# Patient Record
Sex: Male | Born: 1960 | Race: White | Hispanic: No | Marital: Single | Smoking: Former smoker
Health system: Southern US, Community
[De-identification: ages and names within clinical notes are randomized; demographics above are authoritative.]

## PROBLEM LIST (undated history)

## (undated) DIAGNOSIS — F32A Depression, unspecified: Secondary | ICD-10-CM

## (undated) DIAGNOSIS — M797 Fibromyalgia: Secondary | ICD-10-CM

## (undated) DIAGNOSIS — E7800 Pure hypercholesterolemia, unspecified: Secondary | ICD-10-CM

## (undated) DIAGNOSIS — M51369 Other intervertebral disc degeneration, lumbar region without mention of lumbar back pain or lower extremity pain: Secondary | ICD-10-CM

## (undated) DIAGNOSIS — K900 Celiac disease: Secondary | ICD-10-CM

## (undated) DIAGNOSIS — I10 Essential (primary) hypertension: Secondary | ICD-10-CM

## (undated) DIAGNOSIS — M5136 Other intervertebral disc degeneration, lumbar region: Secondary | ICD-10-CM

## (undated) DIAGNOSIS — D649 Anemia, unspecified: Secondary | ICD-10-CM

## (undated) DIAGNOSIS — F329 Major depressive disorder, single episode, unspecified: Secondary | ICD-10-CM

## (undated) HISTORY — PX: APPENDECTOMY: SHX54

## (undated) HISTORY — PX: CHOLECYSTECTOMY: SHX55

---

## 2001-11-22 ENCOUNTER — Emergency Department (HOSPITAL_COMMUNITY): Admission: EM | Admit: 2001-11-22 | Discharge: 2001-11-22 | Payer: Self-pay | Admitting: Emergency Medicine

## 2001-11-22 ENCOUNTER — Encounter: Payer: Self-pay | Admitting: Emergency Medicine

## 2004-04-11 ENCOUNTER — Emergency Department (HOSPITAL_COMMUNITY): Admission: EM | Admit: 2004-04-11 | Discharge: 2004-04-11 | Payer: Self-pay | Admitting: Emergency Medicine

## 2006-07-19 ENCOUNTER — Emergency Department (HOSPITAL_COMMUNITY): Admission: EM | Admit: 2006-07-19 | Discharge: 2006-07-19 | Payer: Self-pay | Admitting: Emergency Medicine

## 2006-12-02 ENCOUNTER — Encounter: Payer: Self-pay | Admitting: Pediatrics

## 2006-12-02 ENCOUNTER — Inpatient Hospital Stay (HOSPITAL_COMMUNITY): Admission: AD | Admit: 2006-12-02 | Discharge: 2006-12-03 | Payer: Self-pay | Admitting: Pediatrics

## 2007-12-11 ENCOUNTER — Emergency Department (HOSPITAL_COMMUNITY): Admission: EM | Admit: 2007-12-11 | Discharge: 2007-12-11 | Payer: Self-pay | Admitting: Emergency Medicine

## 2009-09-05 ENCOUNTER — Observation Stay (HOSPITAL_COMMUNITY): Admission: EM | Admit: 2009-09-05 | Discharge: 2009-09-06 | Payer: Self-pay | Admitting: Internal Medicine

## 2009-09-05 ENCOUNTER — Encounter: Payer: Self-pay | Admitting: Emergency Medicine

## 2010-10-28 ENCOUNTER — Encounter: Payer: Self-pay | Admitting: Cardiology

## 2011-01-08 LAB — BASIC METABOLIC PANEL
Chloride: 106 mEq/L (ref 96–112)
Creatinine, Ser: 0.9 mg/dL (ref 0.4–1.5)
GFR calc Af Amer: >60 mL/min (ref >60)
GFR calc non Af Amer: >60 mL/min (ref >60)
Potassium: 4.2 mEq/L (ref 3.5–5.1)

## 2011-01-08 LAB — DIFFERENTIAL
Eosinophils Absolute: 0.2 10*3/uL (ref 0.0–0.7)
Lymphocytes Relative: 36 % (ref 12–46)
Lymphs Abs: 2.3 10*3/uL (ref 0.7–4.0)
Monocytes Relative: 12 % (ref 3–12)
Neutrophils Relative %: 49 % (ref 43–77)

## 2011-01-08 LAB — LIPID PANEL
Cholesterol: 137 mg/dL (ref 0–200)
HDL: 24 mg/dL — ABNORMAL LOW (ref >39)
LDL Cholesterol: 104 mg/dL — ABNORMAL HIGH (ref 0–99)
Total CHOL/HDL Ratio: 5.7 RATIO
Triglycerides: 43 mg/dL (ref <150)

## 2011-01-08 LAB — IRON AND TIBC: Iron: <10 ug/dL — ABNORMAL LOW (ref 42–135)

## 2011-01-08 LAB — CBC
MCV: 75.7 fL — ABNORMAL LOW (ref 78.0–100.0)
RBC: 4 MIL/uL — ABNORMAL LOW (ref 4.22–5.81)
WBC: 6.5 10*3/uL (ref 4.0–10.5)

## 2011-01-08 LAB — VITAMIN B12: Vitamin B-12: 340 pg/mL (ref 211–911)

## 2011-01-09 LAB — CARDIAC PANEL(CRET KIN+CKTOT+MB+TROPI)
CK, MB: 0.8 ng/mL (ref 0.3–4.0)
CK, MB: 0.9 ng/mL (ref 0.3–4.0)
Relative Index: INVALID (ref 0.0–2.5)
Relative Index: INVALID (ref 0.0–2.5)
Total CK: 93 U/L (ref 7–232)
Total CK: 96 U/L (ref 7–232)

## 2011-01-09 LAB — SEDIMENTATION RATE: Sed Rate: 5 mm/hr (ref 0–16)

## 2011-01-09 LAB — BASIC METABOLIC PANEL
CO2: 25 mEq/L (ref 19–32)
Calcium: 8.4 mg/dL (ref 8.4–10.5)
Creatinine, Ser: 0.7 mg/dL (ref 0.4–1.5)
Glucose, Bld: 103 mg/dL — ABNORMAL HIGH (ref 70–99)

## 2011-01-09 LAB — CBC
Hemoglobin: 10.7 g/dL — ABNORMAL LOW (ref 13.0–17.0)
MCHC: 31.7 g/dL (ref 30.0–36.0)
RDW: 20 % — ABNORMAL HIGH (ref 11.5–15.5)

## 2011-01-09 LAB — IRON AND TIBC
Iron: 16 ug/dL — ABNORMAL LOW (ref 42–135)
Iron: 18 ug/dL — ABNORMAL LOW (ref 42–135)
TIBC: 411 ug/dL (ref 215–435)

## 2011-01-09 LAB — DIFFERENTIAL
Basophils Absolute: 0 10*3/uL (ref 0.0–0.1)
Eosinophils Relative: 3 % (ref 0–5)
Lymphocytes Relative: 36 % (ref 12–46)
Monocytes Absolute: 0.9 10*3/uL (ref 0.1–1.0)

## 2011-01-09 LAB — PROTIME-INR
INR: 1.08 (ref 0.00–1.49)
Prothrombin Time: 13.9 seconds (ref 11.6–15.2)

## 2011-01-09 LAB — URINALYSIS, ROUTINE W REFLEX MICROSCOPIC
Bilirubin Urine: NEGATIVE
Glucose, UA: NEGATIVE mg/dL
Hgb urine dipstick: NEGATIVE
Specific Gravity, Urine: 1.006 (ref 1.005–1.030)
Urobilinogen, UA: 0.2 mg/dL (ref 0.0–1.0)

## 2011-01-09 LAB — LIPID PANEL: VLDL: 7 mg/dL (ref 0–40)

## 2011-01-09 LAB — CK TOTAL AND CKMB (NOT AT ARMC)
CK, MB: 1 ng/mL (ref 0.3–4.0)
Total CK: 112 U/L (ref 7–232)

## 2011-01-09 LAB — RETICULOCYTES
RBC.: 4.53 MIL/uL (ref 4.22–5.81)
Retic Ct Pct: 2 % (ref 0.4–3.1)

## 2011-01-09 LAB — APTT: aPTT: 30 seconds (ref 24–37)

## 2011-01-09 LAB — VITAMIN B12
Vitamin B-12: 301 pg/mL (ref 211–911)
Vitamin B-12: 374 pg/mL (ref 211–911)

## 2011-02-22 NOTE — Discharge Summary (Signed)
Logan Booker, Logan Booker NO.:  [id number]   MEDICAL RECORD NO.:  [id number]          PATIENT TYPE:  INP   LOCATION:  3012                         FACILITY:  [facility]   PHYSICIAN:  Deanna Artis. Hickling, M.D.DATE OF BIRTH:  [birth year]   DATE OF ADMISSION:  12/02/2006  DATE OF DISCHARGE:  12/03/2006                               DISCHARGE SUMMARY   FINAL DIAGNOSIS:  Myelopathy, C6 level.  Evaluated for central cord  syndrome.   PROCEDURES:  MRI of the cervical spine.   COMPLICATIONS:  None.   HOSPITAL COURSE:  The patient was admitted with acute weakness in the  morning, unable to move his limbs well.  Dribbled on himself on the way  to the commode.  He had had a one week history of left leg weakness that  he noted after he fell trying to get out of the shower.   The patient had a history of 2-3 weeks of sleeping excessively and a  feeling of being hot.  His past surgical histories had included root  canal extraction, repair of right fractured shoulder, and appendectomy.   His examination showed weakness that was rather symmetric and mild to  moderate in the upper extremities, moderate to severe in the lower  extremities.  He had a C5-6 level sensation, but preserved rectal tone.  His reflexes are diminished in the upper extremities, brisk in the lower  extremities.  He did not have clonus, nor did he have extensor plantar  responses.   The patient had an MRI scan of the cervical spine, but did not show  evidence of a cord lesion.  It showed mild degenerative joint disease  and C5-6 spondylosis.  Laboratory studies, however, showed evidence of a  potassium of 3.3, which rose to 3.9 with hydration.  His albumin was  2.4.  Sedimentation rate 4.  HIV nonreactive.  Angiotensin converting  enzyme normal.  Liver functions normal.  ANA also negative.  He had a  urinalysis to look for proteinuria, which was negative.  He had elevated  glucose randomly that was 170 and  normal renal function.   Over time, the patient improved in his symptoms to the point where he  felt normal.  We believe that he had hypokalemia secondary to  hydrochlorothiazide, hypoalbuminemia for reasons that are unknown,  hypocalcemia related to the hypoalbuminemia.  We checked a PT/PTT for  liver function and a prealbumin, which was normal, and discharged him  home.  He was to follow up with his primary care doctor.   DISCHARGE MEDICINES:  Included:  1. Triamterene 37.5 mg.  2. Hydrochlorothiazide 12.5 mg daily.  3. Potassium chloride 10 mEq three times a day after meals.      Deanna Artis. Sharene Skeans, M.D.  Electronically Signed     [facility]  D:  01/19/2007  T:  01/19/2007  Job:  [id number]

## 2011-02-22 NOTE — H&P (Signed)
NAMEJOURNEY, RATTERMAN NO.:  [id number]   MEDICAL RECORD NO.:  [id number]          PATIENT TYPE:  INP   LOCATION:  3012                         FACILITY:  [facility]   PHYSICIAN:  Deanna Artis. Hickling, M.D.DATE OF BIRTH:  21-Sep-1961   DATE OF ADMISSION:  12/02/2006  DATE OF DISCHARGE:                              HISTORY & PHYSICAL   CHIEF COMPLAINT:  Weak all over.   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  He is unable to  move his limbs well enough to get out of bed and get to the [place].  He  dribbled on himself on the way to the commode.  He had difficulty  urinating.   He has had a 1-week history of left leg weakness that was noted after he  fell getting out of the shower.  He has also had some pain in his left  hip.   REVIEW OF SYSTEMS:  GENERAL:  He has been sleeping excessively for 2 to  3 weeks.  He has normal appetite.  He had an episode tonight as being  hot and red all over, and this improved after a shower.  His sweating  has been normal.  HEAD AND NECK:  No otitis, pharyngitis, or sinusitis.  PULMONARY:  No asthma, bronchitis, pneumonia, or shortness of breath.  CARDIOVASCULAR:  No hypertension, murmur, congenital heart disease, or  chest pain.  GASTROINTESTINAL:  No nausea, vomiting, diarrhea, or  constipation.  GENITOURINARY:  No urinary tract infection, hematuria.  MUSCULOSKELETAL:  No fractures, sprains, or deformities.  No myalgias or  arthralgias.  ENDOCRINE:  The patient has an elevated glucose, does not  have a past history of diabetes.  No thyroid disease.  ALLERGY/IMMUNOLOGY:  No known environmental allergies.  PSYCHIATRIC:  No  depression.  He does have anxiety and occasionally takes Xanax.  NEUROLOGIC:  No diplopia, dysarthria, dysphagia, tinnitus, syncope,  vertigo.  He has weakness and numbness, particularly in the scalp.  There is hypoesthesia below the  both shoulders.  No seizures, no ataxia,  no incontinence of bowel or bladder as noted above.  A 12-system review  otherwise negative.   PAST MEDICAL HISTORY:  Generally healthy.   PAST SURGICAL HISTORY:  1. Root canal extraction yesterday.  2. Repair of the right fractured shoulder years ago.  3. Appendectomy years ago.   CURRENT MEDICATIONS:  1. Ambien.  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  He takes Xanax 0.5 mg 3 times a week after work.  2. Oxycodone 10/325, after his root canal, every 4 to 6 hours as      needed.  3. Clindamycin 150 mg 2 caps 4 times a day, a.c. and q.h.s.  4. Triamterene.  5. Hydrochlorothiazide 37.5/25 in the morning.  6. Methylprednisolone 4 mg Dosepak to regress over 6 days.   DRUG ALLERGIES:  None.   ENVIRONMENTAL ALLERGIES:  Bee sting.   SOCIAL HISTORY:  No tobacco.  He drinks occasional wine.  No use of  drugs.  He has a significant other.  He lives in [place].   EXAMINATION:  VITAL SIGNS:  Temperature 98.3, blood pressure 148/56,  resting pulse 101, respirations 20, pulse oximetry 97%.  HEENT:  Ears, nose, and throat:  No infections.  NECK:  Supple neck.  No tenderness.  LUNGS:  Clear.  HEART:  No murmurs.  Pulse is normal.  ABDOMEN:  Soft, bowel sounds normal.  No hepatosplenomegaly.  EXTREMITIES:  Normal.  His legs are intermittently spastic.  NEUROLOGIC EXAMINATION:  Awake and alert, without aphasia.  Cranial nerves:  Round, reactive pupils.  Visual fields full.  Extraocular movements full.  Symmetric facial strength.  Midline tongue.  Air conduction is greater than bone conduction.  Motor examination:  Neck is 4+, deltoids 3, biceps 4 to 4+, triceps 4-,  hip extensors and flexors 4, grip 4+.  He can barely appose his fingers  with his thumb.  Legs:  Hip flexors 2, psoas 3, knee flexors and  extensors 2.  It is difficult to overcome his spasticity.  Foot:  Dorsiflexors 3, plantar flexors 5.  He has a  C5-6 level.  Rectal tone is  preserved.  Cerebellar:  No tremor on the finger-to-nose.  Gait cannot be tested.  Deep tendon reflexes were diminished in the upper extremities and brisk  in the lower extremities, without clonus.  He had neutral planter  response.   IMPRESSION:  1. Myelopathy, C6 level.  Differential diagnosis is wide and includes      infection, demyelination, neoplasm, arteriovenous malformation,      unlikely trauma.  This may be a central cord syndrome.  2. Hypokalemia.  3. Glucose intolerance.   PLAN:  1. MRI of the C-spine stat.  2. Laboratories we will recheck after replacing potassium chloride 40      mEq with BMET at 1800 hours.  We will check HIV status,      sedimentation rate, ANA, angiotensin converting enzyme, capillary      glucoses every 4 hours.  He will be admitted to the ICU.  Further      workup and treatment will depend upon what is seen on the MRI scan.      Deanna Artis. Sharene Skeans, M.D.  Electronically Signed     [facility]/MED[facility]  D:  12/02/2006  T:  12/02/2006  Job:  [id number]

## 2013-07-30 ENCOUNTER — Ambulatory Visit: Payer: Self-pay | Admitting: Specialist

## 2013-08-10 ENCOUNTER — Ambulatory Visit: Payer: Self-pay | Admitting: Specialist

## 2014-08-29 IMAGING — NM NUCLEAR MEDICINE HEPATOHBILIARY INCLUDE GB
2 series · 12 of 12 positions shown · non-contrast
Comparison: Abdominal ultrasound-07/30/2013

RADIOPHARMACEUTICALS:  8.45 [id number] Choletec

CLINICAL DATA: Right upper quadrant abdominal pain.

EXAM:
NUCLEAR MEDICINE HEPATOBILIARY IMAGING WITH GALLBLADDER EF
TECHNIQUE: Sequential images of the abdomen were obtained [DATE] minutes
following intravenous administration of radiopharmaceutical. After
oral ingestion of Ensure, gallbladder ejection fraction was
determined. At 60 min, normal ejection fraction is greater than 33%.

[Series 1000: gallbladder dynamic (results) · 4.80mm/px · 6 of 120 frames shown]
[frame 11/120]
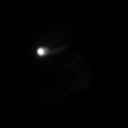
[frame 31/120]
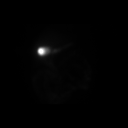
[frame 51/120]
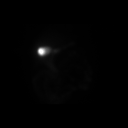
[frame 71/120]
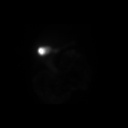
[frame 91/120]
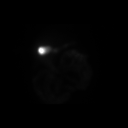
[frame 111/120]
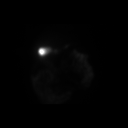

[Series 1000: gallbladder dynamic · 4.80mm/px · 6 of 120 frames shown]
[frame 11/120]
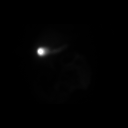
[frame 31/120]
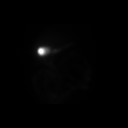
[frame 51/120]
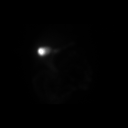
[frame 71/120]
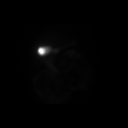
[frame 91/120]
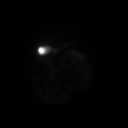
[frame 111/120]
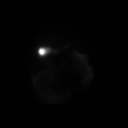

[12 of 12 positions shown; findings below may reference images not displayed]

FINDINGS: There is homogeneous distribution of injected radiotracer throughout
the hepatic parenchyma. There is early opacification of the
gallbladder, initially seen on the 20 anterior projection planar
image.

There is early excretion of radiotracer with opacification of the
proximal small bowel, initially seen on the 15 min anterior
projection planar image.

Gallbladder ejection fraction: 33%, borderline decreased as normal
gallbladder ejection fraction with Ensure is greater than 33%.

The patient did not experience symptoms after oral ingestion of
Ensure.
IMPRESSION: 1.  No scintigraphic evidence of acute cholecystitis.

2. Borderline decreased ejection fraction of 33% (normal gallbladder
ejection fraction within Ensure greater than 33%).

## 2016-07-01 ENCOUNTER — Other Ambulatory Visit: Payer: Self-pay | Admitting: Physician Assistant

## 2016-07-01 ENCOUNTER — Emergency Department (HOSPITAL_COMMUNITY): Payer: Managed Care, Other (non HMO)

## 2016-07-01 ENCOUNTER — Emergency Department (HOSPITAL_COMMUNITY)
Admission: EM | Admit: 2016-07-01 | Discharge: 2016-07-01 | Disposition: A | Discharge status: Home / Self Care | Payer: Managed Care, Other (non HMO) | Attending: Emergency Medicine | Admitting: Emergency Medicine

## 2016-07-01 ENCOUNTER — Encounter (HOSPITAL_COMMUNITY): Payer: Self-pay | Admitting: *Deleted

## 2016-07-01 DIAGNOSIS — R55 Syncope and collapse: Secondary | ICD-10-CM | POA: Diagnosis not present

## 2016-07-01 DIAGNOSIS — I10 Essential (primary) hypertension: Secondary | ICD-10-CM | POA: Insufficient documentation

## 2016-07-01 DIAGNOSIS — Z87891 Personal history of nicotine dependence: Secondary | ICD-10-CM | POA: Diagnosis not present

## 2016-07-01 DIAGNOSIS — R079 Chest pain, unspecified: Secondary | ICD-10-CM

## 2016-07-01 DIAGNOSIS — R0789 Other chest pain: Secondary | ICD-10-CM | POA: Insufficient documentation

## 2016-07-01 DIAGNOSIS — Z7982 Long term (current) use of aspirin: Secondary | ICD-10-CM | POA: Diagnosis not present

## 2016-07-01 DIAGNOSIS — Z79899 Other long term (current) drug therapy: Secondary | ICD-10-CM | POA: Diagnosis not present

## 2016-07-01 HISTORY — DX: Depression, unspecified: F32.A

## 2016-07-01 HISTORY — DX: Major depressive disorder, single episode, unspecified: F32.9

## 2016-07-01 HISTORY — DX: Celiac disease: K90.0

## 2016-07-01 HISTORY — DX: Fibromyalgia: M79.7

## 2016-07-01 HISTORY — DX: Pure hypercholesterolemia, unspecified: E78.00

## 2016-07-01 HISTORY — DX: Anemia, unspecified: D64.9

## 2016-07-01 HISTORY — DX: Essential (primary) hypertension: I10

## 2016-07-01 HISTORY — DX: Other intervertebral disc degeneration, lumbar region: M51.36

## 2016-07-01 HISTORY — DX: Other intervertebral disc degeneration, lumbar region without mention of lumbar back pain or lower extremity pain: M51.369

## 2016-07-01 LAB — CBC WITH DIFFERENTIAL/PLATELET
BASOS PCT: 0 %
Basophils Absolute: 0 10*3/uL (ref 0.0–0.1)
EOS ABS: 0.2 10*3/uL (ref 0.0–0.7)
Eosinophils Relative: 2 %
HCT: 47.9 % (ref 39.0–52.0)
HEMOGLOBIN: 15.8 g/dL (ref 13.0–17.0)
Lymphocytes Relative: 45 %
Lymphs Abs: 4.1 10*3/uL — ABNORMAL HIGH (ref 0.7–4.0)
MCH: 31.6 pg (ref 26.0–34.0)
MCHC: 33 g/dL (ref 30.0–36.0)
MCV: 95.8 fL (ref 78.0–100.0)
Monocytes Absolute: 0.7 10*3/uL (ref 0.1–1.0)
Monocytes Relative: 7 %
NEUTROS PCT: 46 %
Neutro Abs: 4.2 10*3/uL (ref 1.7–7.7)
PLATELETS: 274 10*3/uL (ref 150–400)
RBC: 5 MIL/uL (ref 4.22–5.81)
RDW: 15.9 % — AB (ref 11.5–15.5)
WBC: 9.2 10*3/uL (ref 4.0–10.5)

## 2016-07-01 LAB — I-STAT TROPONIN, ED: TROPONIN I, POC: 0.01 ng/mL (ref 0.00–0.08)

## 2016-07-01 LAB — COMPREHENSIVE METABOLIC PANEL
ALT: 56 U/L (ref 17–63)
ANION GAP: 9 (ref 5–15)
AST: 65 U/L — AB (ref 15–41)
Albumin: 3.4 g/dL — ABNORMAL LOW (ref 3.5–5.0)
Alkaline Phosphatase: 87 U/L (ref 38–126)
BILIRUBIN TOTAL: 0.4 mg/dL (ref 0.3–1.2)
BUN: 6 mg/dL (ref 6–20)
CHLORIDE: 108 mmol/L (ref 101–111)
CO2: 20 mmol/L — ABNORMAL LOW (ref 22–32)
Calcium: 8.3 mg/dL — ABNORMAL LOW (ref 8.9–10.3)
Creatinine, Ser: 0.69 mg/dL (ref 0.61–1.24)
GFR calc Af Amer: >60 mL/min (ref >60)
Glucose, Bld: 113 mg/dL — ABNORMAL HIGH (ref 65–99)
POTASSIUM: 4 mmol/L (ref 3.5–5.1)
Sodium: 137 mmol/L (ref 135–145)
TOTAL PROTEIN: 6.3 g/dL — AB (ref 6.5–8.1)

## 2016-07-01 LAB — TROPONIN I

## 2016-07-01 LAB — D-DIMER, QUANTITATIVE (NOT AT ARMC): D DIMER QUANT: 0.37 ug{FEU}/mL (ref 0.00–0.50)

## 2016-07-01 MED ORDER — NITROGLYCERIN 0.4 MG SL SUBL: 0.4000 mg | SUBLINGUAL_TABLET | SUBLINGUAL | 0 refills | Status: AC | PRN

## 2016-07-01 NOTE — ED Triage Notes (Addendum)
Over the past two weeks pt has had intermittent episodes of chest pain than on Saturday morning had a syncopal episode where he woke up and had soiled himself pt than went to bed and slept from Sat till Sunday morning than woke up went back to bed and was woken up last night at 4am with chest pain went to his PMD this morning and had another syncopal episode with some chest pain  Pt took a 325mg  ASA and 1 sl nitro at the PMD and got relief

## 2016-07-01 NOTE — Consult Note (Signed)
CARDIOLOGY CONSULT NOTE   Patient ID: Logan Booker MRN: [id number], DOB/AGE: 07-05-1961   Admit date: 07/01/2016  Requesting Physician: Dr. Fayrene FearingJames Primary Physician: Arnette FeltsMike Duran PA-C Primary Cardiologist: New.  Reason for admission: Chest pain and syncope   Pt. Profile:  Logan Booker is a 55 y.o. male with a history of obesity, HLD, HTN, OSA on CPAP, celiac disease with chronic anemia and fibromyalgia who [facility] with chest pain and syncope.   He presented with severe chest pain in 2010 and had multiple risk factors for CAD. Given the severity of his symptoms it was decided to proceed with coronary angiography. Heart catheterization showed normal coronary arteries without hemodynamically significant lesions as well as normal LV function and no significant mitral regurg or aortic stenosis.  He is followed by Karel JarvisMike Duran [facility] who is his [facility]CP. He ordered an echo in 04/2015 which showed EF 65-70%, mild LAE, mild MR, mild TR, mild-mod pulm HTN RV systolic pressure 46.93. He also had a stress test around this time that was reportedly normal but I do not have a copy.   He woke up this AM and went to the Dr's office and threw up and passed out in the office and was sent here.   He was in his usual state of health until about 2 weeks ago when he started to notice chest pressure with radiation into his left side including his leg. Worse with exertion. Also notes exertional dyspnea and diaphoresis. Other associated symptoms include dizziness and syncope while sitting. He has had two episodes of syncope including this AM.  No associated trauma. Both were witnessed at work and he had loss of bowel and bladder control. Just a few minutes ago he had an episode of chest pressure at rest and he felt dizzy but did not pass out. No LE edema currently but says he does get some LE edema after work. No orthopnea or PND. No recent car or air travel. Does sit at desk all day.     Problem List  Past Medical History:  Diagnosis Date  . Anemia   . Celiac disease   . Degenerative disc disease, lumbar   . Depression   . Fibromyalgia   . High cholesterol   . Hypertension     Past Surgical History:  Procedure Laterality Date  . APPENDECTOMY    . CHOLECYSTECTOMY       Allergies  No Known Allergies   Home Medications  Prior to Admission medications   Medication Sig Start Date End Date Taking? Authorizing Provider  aspirin 325 MG tablet Take 325 mg by mouth every 4 (four) hours as needed for mild pain.   Yes Historical Provider, MD  DULoxetine (CYMBALTA) 30 MG capsule Take 30 mg by mouth 2 (two) times daily.   Yes Historical Provider, MD  FeFum-FePoly-FA-B Cmp-C-Biot (INTEGRA PLUS) CAPS Take 1 capsule by mouth daily.   Yes Historical Provider, MD  lisinopril (PRINIVIL,ZESTRIL) 20 MG tablet Take 20 mg by mouth daily.   Yes Historical Provider, MD  Magnesium 125 MG CAPS Take 125 mg by mouth daily.   Yes Historical Provider, MD  Milk Thistle 1000 MG CAPS Take 1,000 mg by mouth daily.   Yes Historical Provider, MD  Probiotic Product (PRO-BIOTIC BLEND PO) Take 1 capsule by mouth daily.   Yes Historical Provider, MD  sertraline (ZOLOFT) 50 MG tablet Take 50 mg by mouth daily.   Yes Historical Provider, MD  Vitamin  D, Ergocalciferol, (DRISDOL) 50000 units CAPS capsule Take 50,000 Units by mouth every 7 (seven) days. ( Sunday of each week )   Yes Historical Provider, MD  Zinc Sulfate (ZINC-220 PO) Take 220 mg by mouth daily.   Yes Historical Provider, MD  simvastatin (ZOCOR) 20 MG tablet Take 20 mg by mouth daily.    Historical Provider, MD  zolpidem (AMBIEN) 10 MG tablet Take 10 mg by mouth at bedtime as needed for sleep.    Historical Provider, MD    Family History  Family History  Problem Relation Age of Onset  . CAD Mother   . CAD Father   . CVA Father   . Autoimmune disease Sister   . Diabetes Brother     Family Status  Relation Status  . Mother  Deceased at age 48  . Father Deceased  . Sister Alive  . Brother Alive     Social History  Social History   Social History  . Marital status: Single    Spouse name: N/A  . Number of children: N/A  . Years of education: N/A   Occupational History  . Not on file.   Social History Main Topics  . Smoking status: Former Games developer  . Smokeless tobacco: Never Used  . Alcohol use Not on file     Comment: occasional  . Drug use:     Types: Marijuana  . Sexual activity: Not on file   Other Topics Concern  . Not on file   Social History Narrative  . No narrative on file     Review of Systems General:  No chills, fever, night sweats or weight changes.  Cardiovascular:  +++ chest pain, +++ dyspnea on exertion, ++ edema, No orthopnea, palpitations, paroxysmal nocturnal dyspnea. Dermatological: No rash, lesions/masses Respiratory: No cough, dyspnea Urologic: No hematuria, dysuria Abdominal:  ++ nausea, vomiting, No diarrhea, bright red blood per rectum, melena, or hematemesis Neurologic:  No visual changes, wkns, changes in mental status. All other systems reviewed and are otherwise negative except as noted above.  Physical Exam  Blood pressure 136/99, pulse 77, temperature 98 F (36.7 C), temperature source Oral, resp. rate 13, height 5\' 9"  (1.753 m), weight 243 lb (110.2 kg), SpO2 97 %.  General: Pleasant, NAD, obese.  Psych: Normal affect. Neuro: Alert and oriented X 3. Moves all extremities spontaneously. HEENT: Normal  Neck: Supple without bruits or JVD. Lungs:  Resp regular and unlabored, CTA. Heart: RRR no s3, s4, or murmurs. Abdomen: Soft, non-tender, non-distended, BS + x 4.  Extremities: No clubbing, cyanosis or edema. DP/PT/Radials 2+ and equal bilaterally.  Labs  No results for input(s): CKTOTAL, CKMB, TROPONINI in the last 72 hours. Lab Results  Component Value Date   WBC 9.2 07/01/2016   HGB 15.8 07/01/2016   HCT 47.9 07/01/2016   MCV 95.8 07/01/2016    PLT 274 07/01/2016     Recent Labs Lab 07/01/16 0931  NA 137  K 4.0  CL 108  CO2 20*  BUN 6  CREATININE 0.69  CALCIUM 8.3*  PROT 6.3*  BILITOT 0.4  ALKPHOS 87  ALT 56  AST 65*  GLUCOSE 113*    No results found for: DDIMER   Radiology/Studies  Dg Chest Port 1 View  Result Date: 07/01/2016 CLINICAL DATA:  Chest pain for 2 weeks. EXAM: PORTABLE CHEST 1 VIEW COMPARISON:  09/05/2009 FINDINGS: The heart size and mediastinal contours are within normal limits. Both lungs are clear. The visualized skeletal structures are unremarkable.  IMPRESSION: No active disease. Electronically Signed   By: Francene Boyers M.D.   On: 07/01/2016 09:53    ECG  NSR with with some TWI in lead III and AVf HR 77   ASSESSMENT AND PLAN  Chest pain: described as a chest pressure. Atypical with radiation to left leg but worse with exertion and relieved by SL NTG. He has has severe chest pain in the past with unremarkable work up including a cath with clean cors in 2010 and normal nuclear stress test at [facility] within the past two years. Troponin neg x1. ECG with no acute changes. Will check a DDimer and troponin I.   Syncope: with loss of bowel and bladder control. Witnessed with no seizure like activity. Some mild confusion after. Does not sound cardiac. He had an episode of chest pain with near syncope while admitted around 11 am. ECG and tele stable.   HTN: BP has been elevated but he did not take his Lisinopril this AM.   Obesity: Body mass index is 35.88 kg/m.   Diet and weight loss recommended.   HLD: continue statin    Signed, Cline Crock, PA-C 07/01/2016, 11:47 AM  Pager [phone]

## 2016-07-01 NOTE — Discharge Instructions (Signed)
Contact your cardiologist office in 48 hours if you have not heard from them regarding your outpatient testing and office appointment.  Return to ER with any new, worsening, or changing symptoms.  If you have another episode of pain. Sit down. Take sublingual nitroglycerin as directed. Return to ER.

## 2016-07-01 NOTE — ED Notes (Signed)
MD at bedside. 

## 2016-07-01 NOTE — ED Provider Notes (Signed)
[facility]-EMERGENCY DEPT Provider Note   CSN: [id number] Arrival date & time: 07/01/16  [id number]     History   Chief Complaint Chief Complaint  Patient presents with  . Chest Pain    chest pain that started at 4am pt has a history of a cath done here     HPI Logan PeekJames Booker is a 55 y.o. male. He presents today for chest pain that started at 4 AM, and a syncopal episode at his physician's office.  Cardiac risk factors include obesity, high cholesterol, hypertension, family history with his father who passed away at age 953 with heart disease. Patient is a Nonsmoker.  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  He had a heart cath in 2010 that was normal.  He states that for the last several weeks he said shortness of breath with exertion and chest pain. Pain is described as in his left chest and pressure. It is intermittent. It states it lasts several hours at a time when it comes on him.  He waking for this morning. He was having pain. He went to his physician's office and had a syncopal episode in the waiting room. He states he felt some palpitations. Was having the left-sided pain. He states "they had to wake me up and aspirin was going on". He states that he was "able to stand and transfer to the wheelchair". He was taken back to an exam room where an EKG was obtained. I do not have this EKG. He was given a nitroglycerin and his pain resolved and he is pain-free upon his arrival here.  He states he's been very fatigued all weekend. He was "barely out of bed". That he was sleeping most the weekend due to fatigue and this pain.  HPI  Past Medical History:  Diagnosis Date  . Anemia   . Celiac disease   . Degenerative disc disease, lumbar   . Depression   . Fibromyalgia   . High cholesterol   . Hypertension     Patient Active Problem List     Diagnosis Date Noted  . Faintness   . Chest pain with moderate risk for cardiac etiology   . Essential hypertension     Past Surgical History:  Procedure Laterality Date  . APPENDECTOMY    . CHOLECYSTECTOMY         Home Medications    Prior to Admission medications   Medication Sig Start Date End Date Taking? Authorizing Provider  aspirin 325 MG tablet Take 325 mg by mouth every 4 (four) hours as needed for mild pain.   Yes Historical Provider, MD  DULoxetine (CYMBALTA) 30 MG capsule Take 30 mg by mouth 2 (two) times daily.   Yes Historical Provider, MD  FeFum-FePoly-FA-B Cmp-C-Biot (INTEGRA PLUS) CAPS Take 1 capsule by mouth daily.   Yes Historical Provider, MD  lisinopril (PRINIVIL,ZESTRIL) 20 MG tablet Take 20 mg by mouth daily.   Yes Historical Provider, MD  Magnesium 125 MG CAPS Take 125 mg by mouth daily.   Yes Historical Provider, MD  Milk Thistle 1000 MG CAPS Take 1,000 mg by mouth daily.   Yes Historical Provider, MD  Probiotic Product (PRO-BIOTIC BLEND PO) Take 1 capsule by mouth daily.   Yes Historical Provider, MD  sertraline (ZOLOFT) 50 MG tablet Take 50 mg by mouth daily.   Yes Historical Provider, MD  Vitamin D, Ergocalciferol, (DRISDOL) 50000 units CAPS  capsule Take 50,000 Units by mouth every 7 (seven) days. ( Sunday of each week )   Yes Historical Provider, MD  Zinc Sulfate (ZINC-220 PO) Take 220 mg by mouth daily.   Yes Historical Provider, MD  simvastatin (ZOCOR) 20 MG tablet Take 20 mg by mouth daily.    Historical Provider, MD  zolpidem (AMBIEN) 10 MG tablet Take 10 mg by mouth at bedtime as needed for sleep.    Historical Provider, MD    Family History Family History  Problem Relation Age of Onset  . CAD Mother   . CAD Father   . CVA Father   . Autoimmune disease Sister   . Diabetes Brother     Social History Social History  Substance Use Topics  . Smoking status: Former Games developer  . Smokeless tobacco: Never Used  . Alcohol use Not on file      Comment: occasional     Allergies   Review of patient's allergies indicates no known allergies.   Review of Systems Review of Systems  Constitutional: Negative for appetite change, chills, diaphoresis, fatigue and fever.  HENT: Negative for mouth sores, sore throat and trouble swallowing.   Eyes: Negative for visual disturbance.  Respiratory: Positive for chest tightness and shortness of breath. Negative for cough and wheezing.   Cardiovascular: Positive for chest pain and palpitations.  Gastrointestinal: Negative for abdominal distention, abdominal pain, diarrhea, nausea and vomiting.  Endocrine: Negative for polydipsia, polyphagia and polyuria.  Genitourinary: Negative for dysuria, frequency and hematuria.  Musculoskeletal: Negative for gait problem.  Skin: Negative for color change, pallor and rash.  Neurological: Positive for syncope. Negative for dizziness, light-headedness and headaches.  Hematological: Does not bruise/bleed easily.  Psychiatric/Behavioral: Negative for behavioral problems and confusion.     Physical Exam Updated Vital Signs BP (!) 151/105   Pulse 73   Temp 98.1 F (36.7 C) (Oral)   Resp 15   Ht 5\' 9"  (1.753 m)   Wt 243 lb (110.2 kg)   SpO2 96%   BMI 35.88 kg/m   Physical Exam  Constitutional: He is oriented to person, place, and time. He appears well-developed and well-nourished. No distress.  HENT:  Head: Normocephalic.  Eyes: Conjunctivae are normal. Pupils are equal, round, and reactive to light. No scleral icterus.  Neck: Normal range of motion. Neck supple. No thyromegaly present.  Cardiovascular: Normal rate and regular rhythm.  Exam reveals no gallop and no friction rub.   Murmur heard.  Systolic murmur is present with a grade of 1/6  4/6 systolic murmur.  Pulmonary/Chest: Effort normal and breath sounds normal. No respiratory distress. He has no wheezes. He has no rales.  Abdominal: Soft. Bowel sounds are normal. He exhibits no  distension. There is no tenderness. There is no rebound.  Musculoskeletal: Normal range of motion.  Neurological: He is alert and oriented to person, place, and time.  Skin: Skin is warm and dry. No rash noted.  Psychiatric: He has a normal mood and affect. His behavior is normal.     ED Treatments / Results  Labs (all labs ordered are listed, but only abnormal results are displayed) Labs Reviewed  CBC WITH DIFFERENTIAL/PLATELET - Abnormal; Notable for the following:       Result Value   RDW 15.9 (*)    Lymphs Abs 4.1 (*)    All other components within normal limits  COMPREHENSIVE METABOLIC PANEL - Abnormal; Notable for the following:    CO2 20 (*)    Glucose,  Bld 113 (*)    Calcium 8.3 (*)    Total Protein 6.3 (*)    Albumin 3.4 (*)    AST 65 (*)    All other components within normal limits  D-DIMER, QUANTITATIVE (NOT AT [facility])  TROPONIN I  I-STAT TROPOININ, ED    EKG  EKG Interpretation  Date/Time:  Monday July 01 2016 09:06:09 EDT Ventricular Rate:  74 PR Interval:    QRS Duration: 83 QT Interval:  378 QTC Calculation: 420 R Axis:   92 Text Interpretation:  Sinus rhythm Borderline right axis deviation Confirmed by Fayrene Fearing  MD, Dymphna Wadley ([id number]) on 07/01/2016 9:10:49 AM       Radiology Dg Chest Port 1 View  Result Date: 07/01/2016 CLINICAL DATA:  Chest pain for 2 weeks. EXAM: PORTABLE CHEST 1 VIEW COMPARISON:  09/05/2009 FINDINGS: The heart size and mediastinal contours are within normal limits. Both lungs are clear. The visualized skeletal structures are unremarkable. IMPRESSION: No active disease. Electronically Signed   By: Francene Boyers M.D.   On: 07/01/2016 09:53    Procedures Procedures (including critical care time)  Medications Ordered in ED Medications - No data to display   Initial Impression / Assessment and Plan / ED Course  I have reviewed the triage vital signs and the nursing notes.  Pertinent labs & imaging results that were available during  my care of the patient were reviewed by me and considered in my medical decision making (see chart for details).  Clinical Course    Pain-free after nitroglycerin and aspirin. Several cardiac risk factors, heart score of 4. 7 years out from a normal heart cath. EKG without acute changes and pain-free. Await first troponin.  Final Clinical Impressions(s) / ED Diagnoses   Final diagnoses:  Chest pain, unspecified chest pain type    Appreciate Dr. Elissa Hefty input. Patient seen in consultation. Dr. Rae Lips I have discussed the patient at length. He felt that with negative enzymes and unchanged EKGs the patient would be appropriate for outpatient follow-up. He felt that he may be having episodes of hypertension that may be giving him his episodes of pain. He recommended nitroglycerin sublingual when necessary for episodes of pain. He recommended office follow-up for Holter monitoring and Myoview stress.  New Prescriptions New Prescriptions   No medications on file     Rolland Porter, MD 07/01/16 1428

## 2016-07-01 NOTE — ED Notes (Signed)
Cardiology MD at bedside. Troponin sent to lab

## 2016-07-01 NOTE — ED Notes (Signed)
Pt states increasing pressure to entire L side of body, increasing dizziness and flushed sensation. No ekg changes on monitor.  MD to be notified.

## 2016-07-11 ENCOUNTER — Telehealth: Payer: Self-pay | Admitting: Cardiology

## 2016-07-11 NOTE — Telephone Encounter (Signed)
Closed encounter °

## 2017-02-06 ENCOUNTER — Emergency Department (HOSPITAL_COMMUNITY)
Admission: EM | Admit: 2017-02-06 | Discharge: 2017-02-06 | Disposition: A | Discharge status: Home / Self Care | Payer: Managed Care, Other (non HMO) | Attending: Emergency Medicine | Admitting: Emergency Medicine

## 2017-02-06 ENCOUNTER — Emergency Department (HOSPITAL_COMMUNITY): Payer: Managed Care, Other (non HMO)

## 2017-02-06 ENCOUNTER — Encounter (HOSPITAL_COMMUNITY): Payer: Self-pay | Admitting: Emergency Medicine

## 2017-02-06 DIAGNOSIS — Z87891 Personal history of nicotine dependence: Secondary | ICD-10-CM | POA: Insufficient documentation

## 2017-02-06 DIAGNOSIS — Z79899 Other long term (current) drug therapy: Secondary | ICD-10-CM | POA: Insufficient documentation

## 2017-02-06 DIAGNOSIS — Z7982 Long term (current) use of aspirin: Secondary | ICD-10-CM | POA: Insufficient documentation

## 2017-02-06 DIAGNOSIS — I10 Essential (primary) hypertension: Secondary | ICD-10-CM | POA: Diagnosis not present

## 2017-02-06 DIAGNOSIS — R0789 Other chest pain: Secondary | ICD-10-CM | POA: Diagnosis not present

## 2017-02-06 DIAGNOSIS — R079 Chest pain, unspecified: Secondary | ICD-10-CM

## 2017-02-06 LAB — BASIC METABOLIC PANEL
ANION GAP: 7 (ref 5–15)
BUN: 8 mg/dL (ref 6–20)
CALCIUM: 8.2 mg/dL — AB (ref 8.9–10.3)
CO2: 24 mmol/L (ref 22–32)
Chloride: 107 mmol/L (ref 101–111)
Creatinine, Ser: 0.69 mg/dL (ref 0.61–1.24)
GFR calc Af Amer: >60 mL/min (ref >60)
GFR calc non Af Amer: >60 mL/min (ref >60)
GLUCOSE: 114 mg/dL — AB (ref 65–99)
Potassium: 4 mmol/L (ref 3.5–5.1)
Sodium: 138 mmol/L (ref 135–145)

## 2017-02-06 LAB — CBC
HCT: 43.8 % (ref 39.0–52.0)
HEMOGLOBIN: 14.8 g/dL (ref 13.0–17.0)
MCH: 33.3 pg (ref 26.0–34.0)
MCHC: 33.8 g/dL (ref 30.0–36.0)
MCV: 98.6 fL (ref 78.0–100.0)
Platelets: 293 10*3/uL (ref 150–400)
RBC: 4.44 MIL/uL (ref 4.22–5.81)
RDW: 14.7 % (ref 11.5–15.5)
WBC: 9.6 10*3/uL (ref 4.0–10.5)

## 2017-02-06 LAB — I-STAT TROPONIN, ED
TROPONIN I, POC: 0 ng/mL (ref 0.00–0.08)
Troponin i, poc: 0 ng/mL (ref 0.00–0.08)

## 2017-02-06 NOTE — ED Provider Notes (Signed)
[facility]-EMERGENCY DEPT Provider Note   CSN: [id number] Arrival date & time: 02/06/17  1602     History   Chief Complaint Chief Complaint  Patient presents with  . Chest Pain    HPI Logan Booker is a 56 y.o. male.  He presents for evaluation of chest discomfort, started last night, while he was at work.  Chest tightness waxes and wanes.  It was accompanied by "feeling hot,", and periods of nausea and vomiting.  He has taken several nitroglycerin tablets in the last 12 hours.  He feels like they are helping somewhat.  He last used nitroglycerin about a year ago, following an emergency department evaluation.  He was evaluated and discharged, after consultation with cardiology.  He was to follow-up with cardiology for outpatient Holter monitoring and cardiac stress test, but failed to do that.  He works as a Buyer, retail.  He does not smoke cigarettes.  Family history is positive for cardiac disease and father, during his 68s.  Patient feels like his symptoms improved today after taking a nitroglycerin and vomiting, while in the emergency department.  This was prior to him being seen, by me.  There are no other known modifying factors.  HPI  Past Medical History:  Diagnosis Date  . Anemia   . Celiac disease   . Degenerative disc disease, lumbar   . Depression   . Fibromyalgia   . High cholesterol   . Hypertension     Patient Active Problem List   Diagnosis Date Noted  . Faintness   . Chest pain with moderate risk for cardiac etiology   . Essential hypertension     Past Surgical History:  Procedure Laterality Date  . APPENDECTOMY    . CHOLECYSTECTOMY         Home Medications    Prior to Admission medications   Medication Sig Start Date End Date Taking? Authorizing Provider  aspirin 325 MG tablet Take 325 mg by mouth every 4 (four) hours as needed for mild pain.   Yes Historical Provider, MD  DULoxetine (CYMBALTA) 30 MG capsule Take 30 mg by mouth 2 (two) times  daily.   Yes Historical Provider, MD  FeFum-FePoly-FA-B Cmp-C-Biot (INTEGRA PLUS) CAPS Take 1 capsule by mouth daily.   Yes Historical Provider, MD  lisinopril (PRINIVIL,ZESTRIL) 20 MG tablet Take 20 mg by mouth daily.   Yes Historical Provider, MD  Magnesium 125 MG CAPS Take 125 mg by mouth daily.   Yes Historical Provider, MD  nitroGLYCERIN (NITROSTAT) 0.4 MG SL tablet Place 1 tablet (0.4 mg total) under the tongue every 5 (five) minutes as needed for chest pain. 07/01/16  Yes Rolland Porter, MD  Probiotic Product (PRO-BIOTIC BLEND PO) Take 1 capsule by mouth daily.   Yes Historical Provider, MD  sertraline (ZOLOFT) 50 MG tablet Take 50 mg by mouth daily.   Yes Historical Provider, MD  simvastatin (ZOCOR) 20 MG tablet Take 20 mg by mouth daily.   Yes Historical Provider, MD  Vitamin D, Ergocalciferol, (DRISDOL) 50000 units CAPS capsule Take 50,000 Units by mouth every 7 (seven) days. ( Sunday of each week )   Yes Historical Provider, MD  Zinc Sulfate (ZINC-220 PO) Take 220 mg by mouth daily.   Yes Historical Provider, MD  zolpidem (AMBIEN) 10 MG tablet Take 10 mg by mouth at bedtime as needed for sleep.   Yes Historical Provider, MD  nitroGLYCERIN (NITROSTAT) 0.4 MG SL tablet Place 1 tablet (0.4 mg total) under the tongue every 5 (  five) minutes as needed for chest pain. Patient not taking: Reported on 02/06/2017 07/01/16   Rolland PorterMark Nivaan, MD    Family History Family History  Problem Relation Age of Onset  . CAD Mother   . CAD Father   . CVA Father   . Autoimmune disease Sister   . Diabetes Brother     Social History Social History  Substance Use Topics  . Smoking status: Former Games developermoker  . Smokeless tobacco: Never Used  . Alcohol use Yes     Comment: occasional     Allergies   Patient has no known allergies.   Review of Systems Review of Systems  All other systems reviewed and are negative.    Physical Exam Updated Vital Signs BP (!) 145/81   Pulse 66   Temp 98.1 F (36.7 C)  (Oral)   Resp 13   Ht 5\' 9"  (1.753 m)   Wt 240 lb (108.9 kg)   SpO2 98%   BMI 35.44 kg/m   Physical Exam  Constitutional: He is oriented to person, place, and time. He appears well-developed. No distress.  Obese  HENT:  Head: Normocephalic and atraumatic.  Right Ear: External ear normal.  Left Ear: External ear normal.  Eyes: Conjunctivae and EOM are normal. Pupils are equal, round, and reactive to light.  Neck: Normal range of motion and phonation normal. Neck supple.  Cardiovascular: Normal rate and regular rhythm.  Exam reveals no gallop and no friction rub.   Murmur (Soft systolic murmur at the base) heard. Pulmonary/Chest: Effort normal and breath sounds normal. He exhibits no bony tenderness.  Abdominal: Soft. There is no tenderness.  Musculoskeletal: Normal range of motion. He exhibits edema (1+ lower leg edema bilaterally).  Neurological: He is alert and oriented to person, place, and time. No cranial nerve deficit or sensory deficit. He exhibits normal muscle tone. Coordination normal.  Skin: Skin is warm, dry and intact.  Psychiatric: He has a normal mood and affect. His behavior is normal. Judgment and thought content normal.  Nursing note and vitals reviewed.    ED Treatments / Results  Labs (all labs ordered are listed, but only abnormal results are displayed) Labs Reviewed  BASIC METABOLIC PANEL - Abnormal; Notable for the following:       Result Value   Glucose, Bld 114 (*)    Calcium 8.2 (*)    All other components within normal limits  CBC  I-STAT TROPOININ, ED  I-STAT TROPOININ, ED    EKG  EKG Interpretation  Date/Time:  Thursday Feb 06 2017 16:07:46 EDT Ventricular Rate:  80 PR Interval:    QRS Duration: 85 QT Interval:  375 QTC Calculation: 433 R Axis:   85 Text Interpretation:  Sinus rhythm Borderline T abnormalities, diffuse leads since last tracing no significant change Confirmed by Mancel BaleWentz, Saquoia Sianez [phone]([id number]) on 02/06/2017 4:27:28 PM        Radiology Dg Chest 2 View  Result Date: 02/06/2017 CLINICAL DATA:  Upper chest pain and shortness breath beginning last night. EXAM: CHEST  2 VIEW COMPARISON:  07/01/2016 FINDINGS: Heart size is normal. Mediastinal shadows are normal. The lungs are clear. No bronchial thickening. No infiltrate, mass, effusion or collapse. Pulmonary vascularity is normal. No bony abnormality. IMPRESSION: Normal chest Electronically Signed   By: Paulina FusiMark  Shogry M.D.   On: 02/06/2017 17:20    Procedures Procedures (including critical care time)  Medications Ordered in ED Medications - No data to display   Initial Impression / Assessment and Plan /  ED Course  I have reviewed the triage vital signs and the nursing notes.  Pertinent labs & imaging results that were available during my care of the patient were reviewed by me and considered in my medical decision making (see chart for details).     Medications - No data to display  Patient Vitals for the past 24 hrs:  BP Temp Temp src Pulse Resp SpO2 Height Weight  02/06/17 1955 (!) 145/81 - - - - - - -  02/06/17 1948 - - - 66 - 98 % - -  02/06/17 1947 - - - - 13 - - -  02/06/17 1820 136/75 - - 70 18 98 % - -  02/06/17 1717 - - - 67 14 99 % - -  02/06/17 1611 - - - - - - 5\' 9"  (1.753 m) 240 lb (108.9 kg)  02/06/17 1609 (!) 145/90 98.1 F (36.7 C) Oral 87 20 97 % - -    8:10 PM Reevaluation with update and discussion. After initial assessment and treatment, an updated evaluation reveals he is comfortable sitting up and has no complaints.  Findings discussed with the patient, and his partner, all questions were answered. Janique Hoefer L    Final Clinical Impressions(s) / ED Diagnoses   Final diagnoses:  Nonspecific chest pain   Nonspecific pain, unlikely to represent cardiac disease.  Prior recommendation for cardiology evaluation, about a year ago.  Doubt ACS, PE, pneumonia or impending vascular collapse.   Nursing Notes Reviewed/ Care  Coordinated Applicable Imaging Reviewed Interpretation of Laboratory Data incorporated into ED treatment  The patient appears reasonably screened and/or stabilized for discharge and I doubt any other medical condition or other [facility] requiring further screening, evaluation, or treatment in the ED at this time prior to discharge.  Plan: Home Medications-continue usual; Home Treatments-rest, fluids; return here if the recommended treatment, does not improve the symptoms; Recommended follow up-PCP as needed, call cardiology for a follow-up appointment regarding stress testing and Holter monitoring.  New Prescriptions New Prescriptions   No medications on file     Mancel Bale, MD 02/06/17 2011

## 2017-02-06 NOTE — Discharge Instructions (Signed)
The testing today was reassuring.  There was no sign of injury to your heart.  It is not clear why you are having this discomfort.  Take 1 baby strength aspirin each day, until you see the cardiologist.

## 2017-02-06 NOTE — ED Triage Notes (Signed)
Pt reports chest pain/shortness of breath starting last night at work. Patient took 1 nitro at work last night; 2 nitro this morning and 325 mg Asprin today.

## 2017-02-06 NOTE — ED Notes (Signed)
Bed: WA04 Expected date:  Expected time:  Means of arrival:  Comments: Triage 1
